# Patient Record
Sex: Female | Born: 1940 | Race: White | Hispanic: No | Smoking: Never smoker
Health system: Southern US, Community
[De-identification: ages and names within clinical notes are randomized; demographics above are authoritative.]

## PROBLEM LIST (undated history)

## (undated) DIAGNOSIS — I10 Essential (primary) hypertension: Secondary | ICD-10-CM

## (undated) DIAGNOSIS — E785 Hyperlipidemia, unspecified: Secondary | ICD-10-CM

## (undated) DIAGNOSIS — E079 Disorder of thyroid, unspecified: Secondary | ICD-10-CM

## (undated) HISTORY — DX: Disorder of thyroid, unspecified: E07.9

## (undated) HISTORY — DX: Essential (primary) hypertension: I10

## (undated) HISTORY — DX: Hyperlipidemia, unspecified: E78.5

---

## 1998-06-10 ENCOUNTER — Ambulatory Visit (HOSPITAL_COMMUNITY): Admission: RE | Admit: 1998-06-10 | Discharge: 1998-06-10 | Payer: Self-pay | Admitting: Internal Medicine

## 1999-09-01 ENCOUNTER — Other Ambulatory Visit: Admission: RE | Admit: 1999-09-01 | Discharge: 1999-09-01 | Payer: Self-pay | Admitting: Internal Medicine

## 2003-02-12 ENCOUNTER — Ambulatory Visit (HOSPITAL_COMMUNITY): Admission: RE | Admit: 2003-02-12 | Discharge: 2003-02-12 | Payer: Self-pay | Admitting: Internal Medicine

## 2003-09-07 ENCOUNTER — Encounter: Admission: RE | Admit: 2003-09-07 | Discharge: 2003-09-07 | Payer: Self-pay | Admitting: Internal Medicine

## 2003-12-31 ENCOUNTER — Other Ambulatory Visit: Admission: RE | Admit: 2003-12-31 | Discharge: 2003-12-31 | Payer: Self-pay | Admitting: Internal Medicine

## 2004-08-31 IMAGING — CT CT PELVIS W/ CM
1 of 2 series · 15 of 32 positions shown, 19 images · IV contrast (GASTRO. & OMNIPAQUE [ID])
Comparison: none

CLINICAL DATA: Left flank pain.   Con ? none.
CT ABDOMEN AND PELVIS WITH CONTRAST
TECHNIQUE: Multislice axial images were obtained through the abdomen after administration of oral contrast and before and after administration of 100 cc of Omnipaque 300 IV contrast.  There are no prior studies for comparison purposes. 
CT ABDOMEN:
The lung bases are clear.  The liver, spleen, pancreas, adrenal glands, and both kidneys have a normal appearance.  No renal 
or ureteral calculi are present.  There are no secondary signs of renal obstruction.  A tiny vascular calcification is seen medial to the right kidney.    No free abdominal fluid or adenopathy are seen.  There is soft tissue prominence and mild narrowing of the ascending colon which persists on the delayed images.  Atherosclerotic calcification is seen within the distal aorta and iliac arteries.  
IMPRESSION
There is soft tissue prominence and mild narrowing of the ascending colon near the hepatic flexure.  A colonoscopy or barium enema are recommended for better characterization of this region.  Differential considerations include marked spasm versus a constricting mass.  
CT PELVIS:
The urinary bladder has an unremarkable appearance.  There is no free pelvic fluid or adenopathy.  The osseous structures are unremarkable.  
Unremarkable CT scan of the pelvis.

[Series 3: routine abdomen · axial · 0.78mm/px · z∈[-401,-26]mm · 15 of 113 slices shown, 19 images]
[im 5/113  soft-tissue]
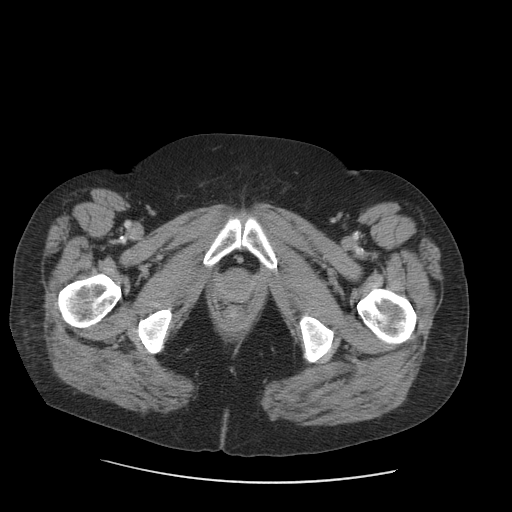
[im 5/113  bone]
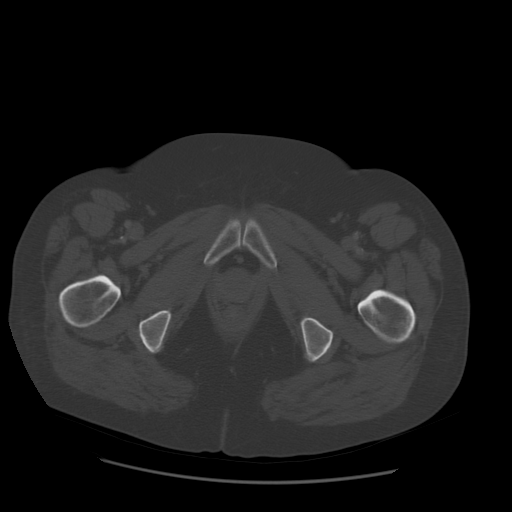
[im 15/113  soft-tissue]
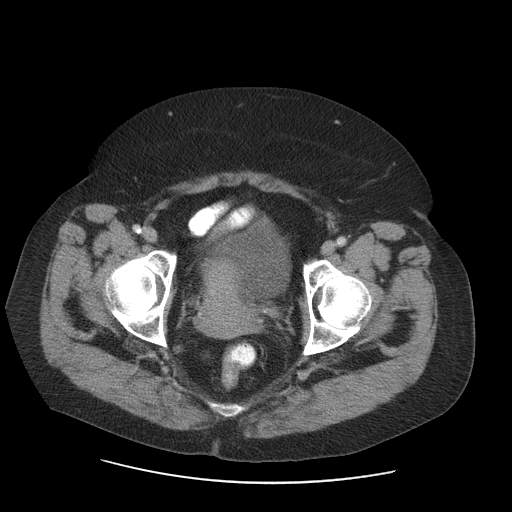
[im 24/113  soft-tissue]
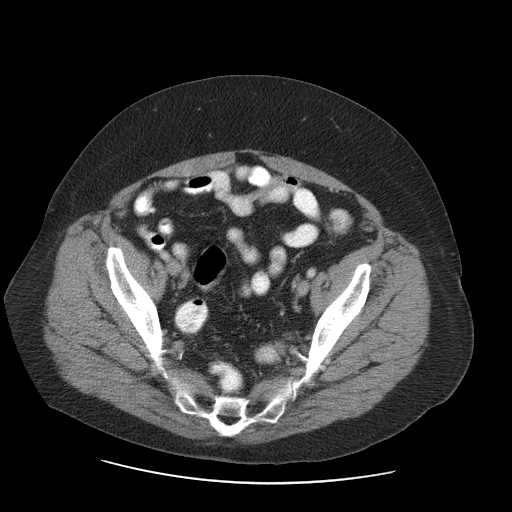
[im 33/113  soft-tissue]
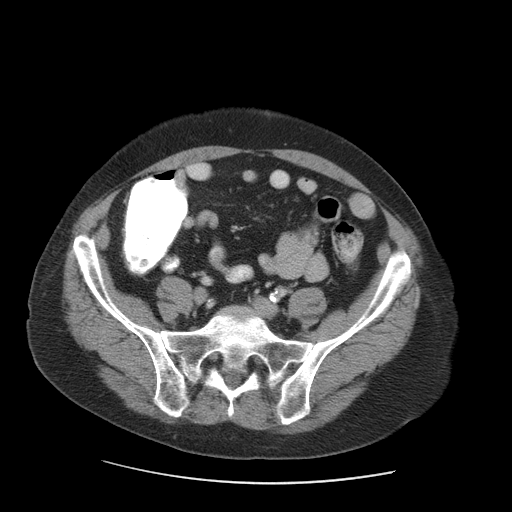
[im 38/113  soft-tissue]
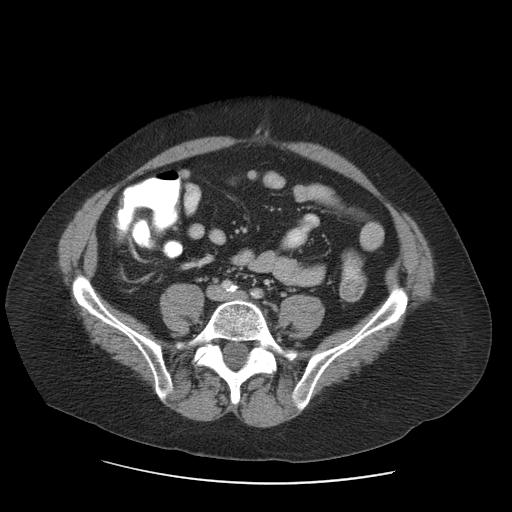
[im 47/113  soft-tissue]
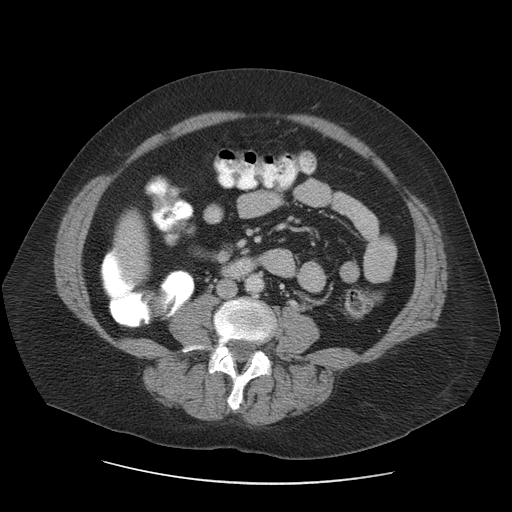
[im 57/113  soft-tissue]
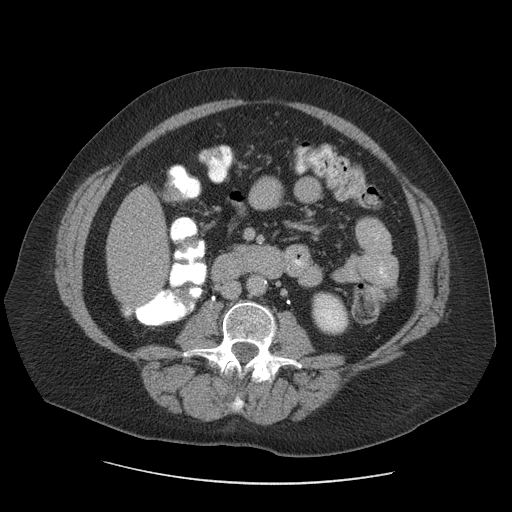
[im 66/113  soft-tissue]
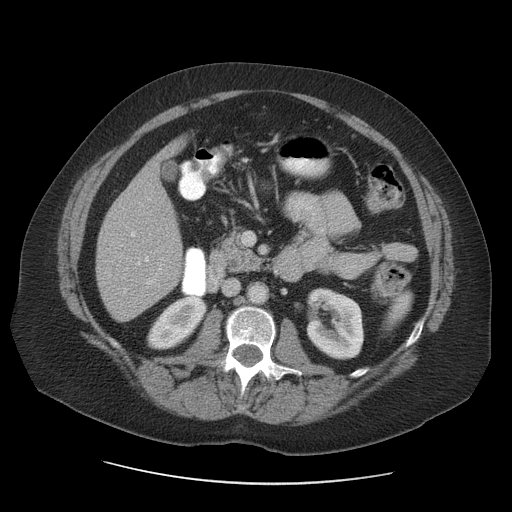
[im 75/113  soft-tissue]
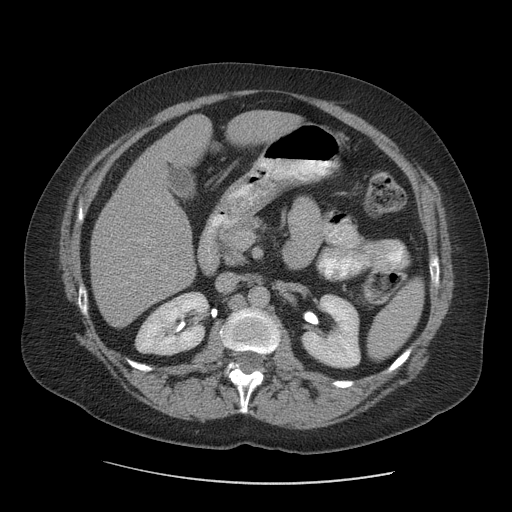
[im 75/113  bone]
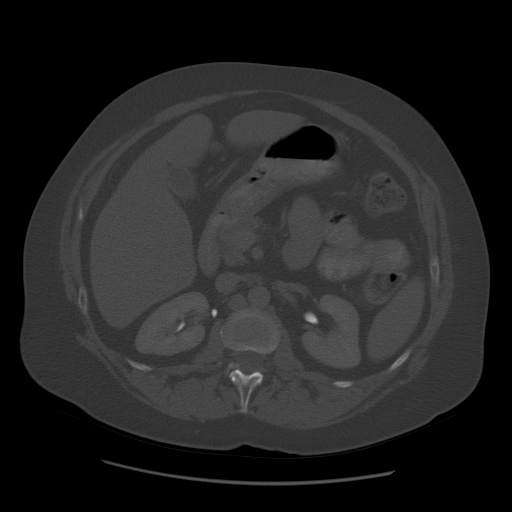
[im 80/113  soft-tissue]
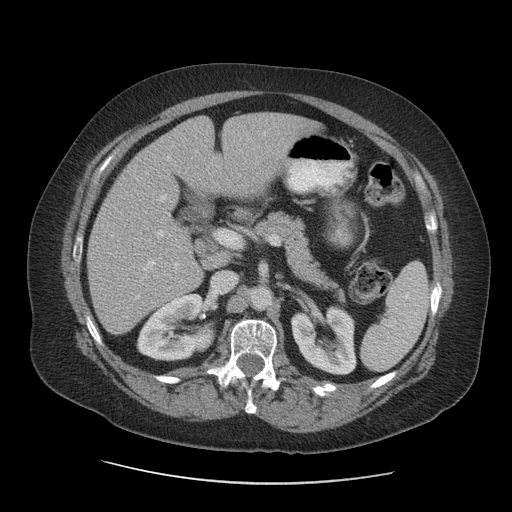
[im 89/113  soft-tissue]
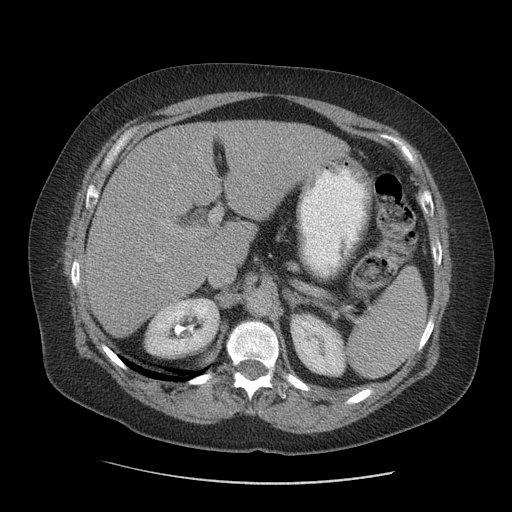
[im 94/113  lung]
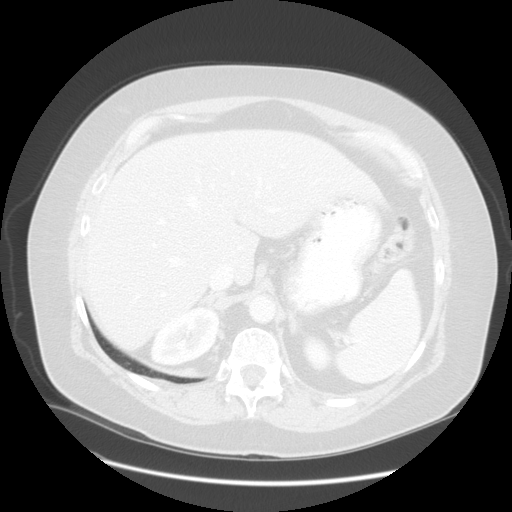
[im 99/113  soft-tissue]
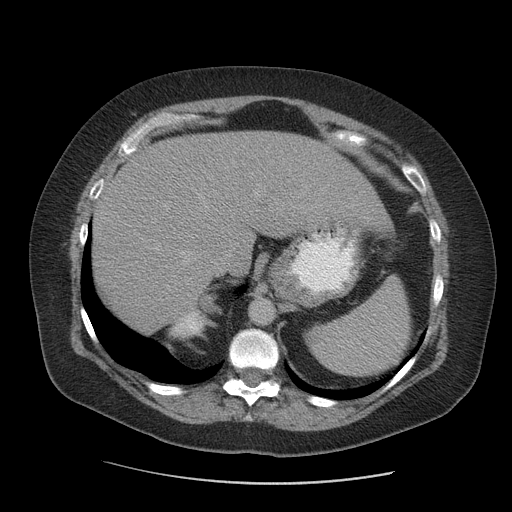
[im 99/113  lung]
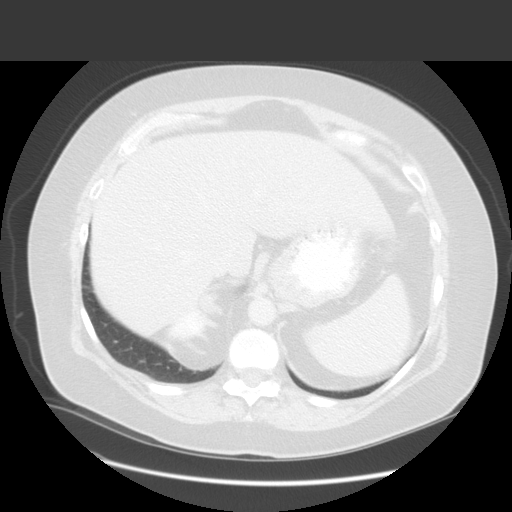
[im 103/113  lung]
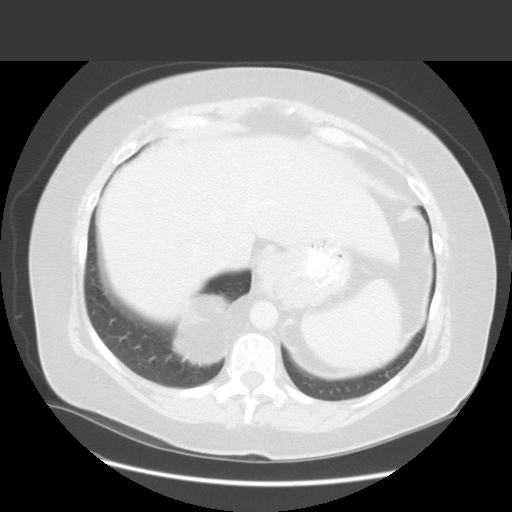
[im 108/113  soft-tissue]
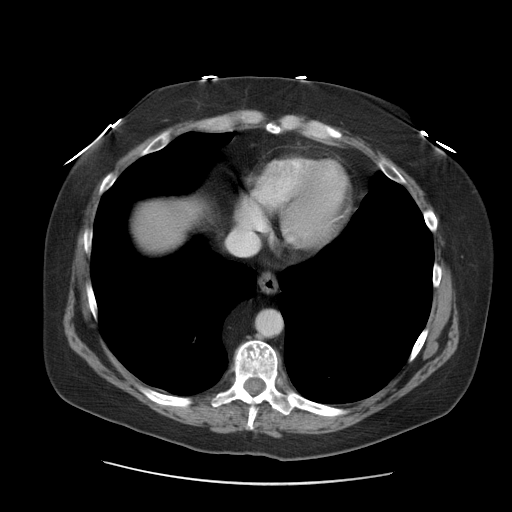
[im 108/113  lung]
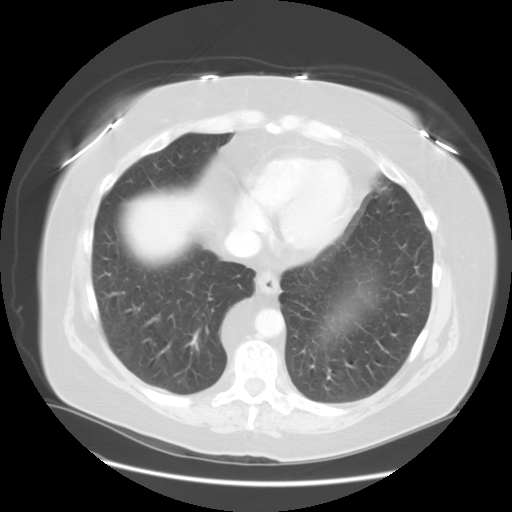

[15 of 32 positions shown; findings below may reference images not displayed]

## 2007-12-17 ENCOUNTER — Other Ambulatory Visit: Admission: RE | Admit: 2007-12-17 | Discharge: 2007-12-17 | Payer: Self-pay | Admitting: Internal Medicine

## 2011-01-18 ENCOUNTER — Other Ambulatory Visit (HOSPITAL_COMMUNITY)
Admission: RE | Admit: 2011-01-18 | Discharge: 2011-01-18 | Disposition: A | Discharge status: Home / Self Care | Payer: Medicare Other | Source: Ambulatory Visit | Attending: Internal Medicine | Admitting: Internal Medicine

## 2011-01-18 DIAGNOSIS — Z124 Encounter for screening for malignant neoplasm of cervix: Secondary | ICD-10-CM | POA: Insufficient documentation

## 2011-02-02 NOTE — Op Note (Signed)
   NAME:  Cork, Oluwanifemi B                         ACCOUNT NO.:  [id number]   MEDICAL RECORD NO.:  [id number]                   PATIENT TYPE:  AMB   LOCATION:  ENDO                                 FACILITY:  [facility]   PHYSICIAN:  Lina Sar, M.D. [facility]               DATE OF BIRTH:  1941-01-08   DATE OF PROCEDURE:  02/12/2003  DATE OF DISCHARGE:                                 OPERATIVE REPORT   PROCEDURE:  Colonoscopy.   INDICATIONS FOR PROCEDURE:  This 70 year old white female is undergoing a  screening colonoscopy.  She denies any GI symptoms.  Her stool was Hemoccult  negative in the past.  She has never had a previous colonoscopy.  There is  no family history of colon cancer.   ENDOSCOPE:  Olympus single chamber video endoscope.   SEDATION:  1. Versed 7 mg IV.  2. Demerol 80 mg IV.   DESCRIPTION OF PROCEDURE:  The Olympus single chamber video endoscope was  passed under direct vision to the sigmoid colon.  The patient was monitored  by pulse oximeter, oxygen saturations were normal.  Her blood pressure was  low as 84 systolic, and her pulse slowed down to 37 to 38 beats per minute  during the procedure.  The anal canal and rectal ampulla was normal.  Retroflexion of endoscope in the rectum showed normal rectal ampulla.  Sigmoid colon was traversed without difficulty, and showed normal appearing  mucosa.  There were no diverticula, although haustral folds were somewhat  enlarged.  I was unable to see any diverticulosis.  The descending colon,  splenic flexure, transverse colon were unremarkable.  The patient required a  lot of sedation.  Hepatic flexure, ascending colon, and cecum were normal.  Colonoscope was then retracted, the colon was decompressed.  Video  photographs of the rectum as well as cecal pouch were obtained.  The patient  tolerated the procedure well.   IMPRESSION:  Normal colonoscopy to the cecum.   PLAN:  1. Use Hemoccult cards.  2.     High fiber diet.  3. Repeat colonoscopy in 10 years.  4. The patient will follow up with Dr. Elmore Guise.                                               Lina Sar, M.D. [facility]    DB/MEDQ  D:  02/12/2003  T:  02/12/2003  Job:  [id number]   cc:   Erskine Speed, M.D.  [address] [place]  [place]  [place] [address]  Fax: [phone]

## 2014-10-07 DIAGNOSIS — H6063 Unspecified chronic otitis externa, bilateral: Secondary | ICD-10-CM | POA: Diagnosis not present

## 2014-10-07 DIAGNOSIS — H6122 Impacted cerumen, left ear: Secondary | ICD-10-CM | POA: Diagnosis not present

## 2014-10-13 DIAGNOSIS — H52223 Regular astigmatism, bilateral: Secondary | ICD-10-CM | POA: Diagnosis not present

## 2014-10-13 DIAGNOSIS — H5203 Hypermetropia, bilateral: Secondary | ICD-10-CM | POA: Diagnosis not present

## 2014-10-29 DIAGNOSIS — E780 Pure hypercholesterolemia: Secondary | ICD-10-CM | POA: Diagnosis not present

## 2014-10-29 DIAGNOSIS — I10 Essential (primary) hypertension: Secondary | ICD-10-CM | POA: Diagnosis not present

## 2015-02-02 DIAGNOSIS — I10 Essential (primary) hypertension: Secondary | ICD-10-CM | POA: Diagnosis not present

## 2015-02-02 DIAGNOSIS — Z0000 Encounter for general adult medical examination without abnormal findings: Secondary | ICD-10-CM | POA: Diagnosis not present

## 2015-02-02 DIAGNOSIS — E039 Hypothyroidism, unspecified: Secondary | ICD-10-CM | POA: Diagnosis not present

## 2015-02-02 DIAGNOSIS — D559 Anemia due to enzyme disorder, unspecified: Secondary | ICD-10-CM | POA: Diagnosis not present

## 2015-02-02 DIAGNOSIS — Z23 Encounter for immunization: Secondary | ICD-10-CM | POA: Diagnosis not present

## 2015-02-02 DIAGNOSIS — E780 Pure hypercholesterolemia: Secondary | ICD-10-CM | POA: Diagnosis not present

## 2015-03-16 DIAGNOSIS — Z1231 Encounter for screening mammogram for malignant neoplasm of breast: Secondary | ICD-10-CM | POA: Diagnosis not present

## 2015-03-23 DIAGNOSIS — I10 Essential (primary) hypertension: Secondary | ICD-10-CM | POA: Diagnosis not present

## 2015-03-23 DIAGNOSIS — F339 Major depressive disorder, recurrent, unspecified: Secondary | ICD-10-CM | POA: Diagnosis not present

## 2015-08-02 DIAGNOSIS — I10 Essential (primary) hypertension: Secondary | ICD-10-CM | POA: Diagnosis not present

## 2015-08-02 DIAGNOSIS — Z23 Encounter for immunization: Secondary | ICD-10-CM | POA: Diagnosis not present

## 2015-08-02 DIAGNOSIS — E039 Hypothyroidism, unspecified: Secondary | ICD-10-CM | POA: Diagnosis not present

## 2015-09-20 DIAGNOSIS — I10 Essential (primary) hypertension: Secondary | ICD-10-CM | POA: Diagnosis not present

## 2015-09-20 DIAGNOSIS — E039 Hypothyroidism, unspecified: Secondary | ICD-10-CM | POA: Diagnosis not present

## 2015-12-08 DIAGNOSIS — H6063 Unspecified chronic otitis externa, bilateral: Secondary | ICD-10-CM | POA: Diagnosis not present

## 2015-12-08 DIAGNOSIS — H6122 Impacted cerumen, left ear: Secondary | ICD-10-CM | POA: Diagnosis not present

## 2016-02-02 DIAGNOSIS — E039 Hypothyroidism, unspecified: Secondary | ICD-10-CM | POA: Diagnosis not present

## 2016-02-02 DIAGNOSIS — I119 Hypertensive heart disease without heart failure: Secondary | ICD-10-CM | POA: Diagnosis not present

## 2016-02-02 DIAGNOSIS — Z0000 Encounter for general adult medical examination without abnormal findings: Secondary | ICD-10-CM | POA: Diagnosis not present

## 2016-04-02 DIAGNOSIS — Z1231 Encounter for screening mammogram for malignant neoplasm of breast: Secondary | ICD-10-CM | POA: Diagnosis not present

## 2016-04-09 DIAGNOSIS — E039 Hypothyroidism, unspecified: Secondary | ICD-10-CM | POA: Diagnosis not present

## 2016-04-09 DIAGNOSIS — E785 Hyperlipidemia, unspecified: Secondary | ICD-10-CM | POA: Diagnosis not present

## 2016-04-09 DIAGNOSIS — I10 Essential (primary) hypertension: Secondary | ICD-10-CM | POA: Diagnosis not present

## 2016-07-05 DIAGNOSIS — I10 Essential (primary) hypertension: Secondary | ICD-10-CM | POA: Diagnosis not present

## 2016-07-05 DIAGNOSIS — Z23 Encounter for immunization: Secondary | ICD-10-CM | POA: Diagnosis not present

## 2016-07-05 DIAGNOSIS — E039 Hypothyroidism, unspecified: Secondary | ICD-10-CM | POA: Diagnosis not present

## 2019-11-22 ENCOUNTER — Ambulatory Visit: Payer: Medicare Other | Attending: Internal Medicine

## 2019-11-22 DIAGNOSIS — Z23 Encounter for immunization: Secondary | ICD-10-CM | POA: Insufficient documentation

## 2019-11-22 NOTE — Progress Notes (Signed)
   Covid-19 Vaccination Clinic  Name:  Dawn Whitney    MRN: [id number] DOB: August 16, 1941  11/22/2019  Dawn Whitney was observed post Covid-19 immunization for 15 minutes without incident. She was provided with Vaccine Information Sheet and instruction to access the V-Safe system.   Dawn Whitney was instructed to call 911 with any severe reactions post vaccine: Marland Kitchen Difficulty breathing  . Swelling of face and throat  . A fast heartbeat  . A bad rash all over body  . Dizziness and weakness   Immunizations Administered    Name Date Dose VIS Date Route   Pfizer COVID-19 Vaccine 11/22/2019  3:50 PM 0.3 mL 08/28/2019 Intramuscular   Manufacturer: [place]   Lot: [id number]   [place]: [id number]

## 2019-11-23 ENCOUNTER — Other Ambulatory Visit: Payer: Self-pay | Admitting: Internal Medicine

## 2019-11-23 DIAGNOSIS — Z13820 Encounter for screening for osteoporosis: Secondary | ICD-10-CM

## 2019-12-22 ENCOUNTER — Ambulatory Visit: Payer: Medicare Other | Attending: Internal Medicine

## 2019-12-22 DIAGNOSIS — Z23 Encounter for immunization: Secondary | ICD-10-CM

## 2019-12-22 NOTE — Progress Notes (Signed)
   Covid-19 Vaccination Clinic  Name:  Dawn Whitney    MRN: [id number] DOB: [birth year]  12/22/2019  Dawn Whitney was observed post Covid-19 immunization for 15 minutes without incident. She was provided with Vaccine Information Sheet and instruction to access the V-Safe system.   Dawn Whitney was instructed to call 911 with any severe reactions post vaccine: Marland Kitchen Difficulty breathing  . Swelling of face and throat  . A fast heartbeat  . A bad rash all over body  . Dizziness and weakness   Immunizations Administered    Name Date Dose VIS Date Route   Pfizer COVID-19 Vaccine 12/22/2019  1:43 PM 0.3 mL 08/28/2019 Intramuscular   Manufacturer: [employer]   Lot: [id number]   [place]: [id number]

## 2020-05-02 ENCOUNTER — Other Ambulatory Visit: Payer: Self-pay

## 2020-05-02 ENCOUNTER — Ambulatory Visit (INDEPENDENT_AMBULATORY_CARE_PROVIDER_SITE_OTHER): Payer: Medicare Other | Admitting: Otolaryngology

## 2020-05-02 VITALS — Temp 97.2°F

## 2020-05-02 DIAGNOSIS — H6123 Impacted cerumen, bilateral: Secondary | ICD-10-CM

## 2020-05-02 NOTE — Progress Notes (Signed)
HPI: Dawn Whitney is a 79 y.o. female who presents for evaluation of wax buildup in both ear canals.  She has had this cleaned previously with Dr Ernesto Rutherford on a regular basis.  However the last time it was cleaned was over a year ago.Marland Kitchen  No past medical history on file.  Social History   Socioeconomic History  . Marital status: Widowed    Spouse name: Not on file  . Number of children: Not on file  . Years of education: Not on file  . Highest education level: Not on file  Occupational History  . Not on file  Tobacco Use  . Smoking status: Not on file  Substance and Sexual Activity  . Alcohol use: Not on file  . Drug use: Not on file  . Sexual activity: Not on file  Other Topics Concern  . Not on file  Social History Narrative  . Not on file   Social Determinants of Health   Financial Resource Strain:   . Difficulty of Paying Living Expenses:   Food Insecurity:   . Worried About Charity fundraiser in the Last Year:   . Arboriculturist in the Last Year:   Transportation Needs:   . Film/video editor (Medical):   Marland Kitchen Lack of Transportation (Non-Medical):   Physical Activity:   . Days of Exercise per Week:   . Minutes of Exercise per Session:   Stress:   . Feeling of Stress :   Social Connections:   . Frequency of Communication with Friends and Family:   . Frequency of Social Gatherings with Friends and Family:   . Attends Religious Services:   . Active Member of Clubs or Organizations:   . Attends Archivist Meetings:   Marland Kitchen Marital Status:    No family history on file. No Known Allergies Prior to Admission medications   Not on File     Positive ROS: Otherwise negative  All other systems have been reviewed and were otherwise negative with the exception of those mentioned in the HPI and as above.  Physical Exam: Constitutional: Alert, well-appearing, no acute distress Ears: External ears without lesions or tenderness. Ear canals are small  bilaterally with a large amount of wax in both ear canals that was cleaned with forceps and curettes.  TMs were clear bilaterally.. Nasal: External nose without lesions. Clear nasal passages Oral: Oropharynx clear. Neck: No palpable adenopathy or masses Respiratory: Breathing comfortably  Skin: No facial/neck lesions or rash noted.  Cerumen impaction removal  Date/Time: 05/02/2020 3:59 PM Performed by: Rozetta Nunnery, MD Authorized by: Rozetta Nunnery, MD   Consent:    Consent obtained:  Verbal   Consent given by:  Patient   Risks discussed:  Pain and bleeding Procedure details:    Location:  L ear and R ear   Procedure type: curette and forceps   Post-procedure details:    Inspection:  TM intact and canal normal   Hearing quality:  Improved   Patient tolerance of procedure:  Tolerated well, no immediate complications Comments:     TMs are clear bilaterally    Assessment: Bilateral cerumen impactions. Small ear canals bilaterally.  Plan: She will follow up on a as needed basis.  Radene Journey, MD

## 2020-10-21 ENCOUNTER — Encounter: Payer: Self-pay | Admitting: Gastroenterology

## 2020-11-01 ENCOUNTER — Encounter: Payer: Self-pay | Admitting: Gastroenterology

## 2020-11-01 ENCOUNTER — Other Ambulatory Visit (INDEPENDENT_AMBULATORY_CARE_PROVIDER_SITE_OTHER): Payer: Medicare Other

## 2020-11-01 ENCOUNTER — Other Ambulatory Visit: Payer: Self-pay

## 2020-11-01 ENCOUNTER — Ambulatory Visit: Payer: Medicare Other | Admitting: Gastroenterology

## 2020-11-01 VITALS — BP 98/60 | HR 57 | Ht 65.0 in | Wt 141.0 lb

## 2020-11-01 DIAGNOSIS — R194 Change in bowel habit: Secondary | ICD-10-CM | POA: Diagnosis not present

## 2020-11-01 DIAGNOSIS — R197 Diarrhea, unspecified: Secondary | ICD-10-CM

## 2020-11-01 DIAGNOSIS — R634 Abnormal weight loss: Secondary | ICD-10-CM

## 2020-11-01 LAB — CBC WITH DIFFERENTIAL/PLATELET
Basophils Absolute: 0.1 10*3/uL (ref 0.0–0.1)
Basophils Relative: 0.8 % (ref 0.0–3.0)
Eosinophils Absolute: 0.2 10*3/uL (ref 0.0–0.7)
Eosinophils Relative: 2.1 % (ref 0.0–5.0)
HCT: 37.4 % (ref 36.0–46.0)
Hemoglobin: 12.8 g/dL (ref 12.0–15.0)
Lymphocytes Relative: 34.1 % (ref 12.0–46.0)
Lymphs Abs: 2.9 10*3/uL (ref 0.7–4.0)
MCHC: 34.1 g/dL (ref 30.0–36.0)
MCV: 89.7 fl (ref 78.0–100.0)
Monocytes Absolute: 0.8 10*3/uL (ref 0.1–1.0)
Monocytes Relative: 9.1 % (ref 3.0–12.0)
Neutro Abs: 4.6 10*3/uL (ref 1.4–7.7)
Neutrophils Relative %: 53.9 % (ref 43.0–77.0)
Platelets: 189 10*3/uL (ref 150.0–400.0)
RBC: 4.17 Mil/uL (ref 3.87–5.11)
RDW: 14.1 % (ref 11.5–15.5)
WBC: 8.5 10*3/uL (ref 4.0–10.5)

## 2020-11-01 LAB — SEDIMENTATION RATE: Sed Rate: 33 mm/hr — ABNORMAL HIGH (ref 0–30)

## 2020-11-01 LAB — BASIC METABOLIC PANEL
BUN: 22 mg/dL (ref 6–23)
CO2: 33 mEq/L — ABNORMAL HIGH (ref 19–32)
Calcium: 9.7 mg/dL (ref 8.4–10.5)
Chloride: 95 mEq/L — ABNORMAL LOW (ref 96–112)
Creatinine, Ser: 1.28 mg/dL — ABNORMAL HIGH (ref 0.40–1.20)
GFR: 39.96 mL/min — ABNORMAL LOW (ref >60.00)
Glucose, Bld: 99 mg/dL (ref 70–99)
Potassium: 3.7 mEq/L (ref 3.5–5.1)
Sodium: 135 mEq/L (ref 135–145)

## 2020-11-01 LAB — C-REACTIVE PROTEIN: CRP: <1 mg/dL (ref 0.5–20.0)

## 2020-11-01 LAB — TSH: TSH: 29.29 u[IU]/mL — ABNORMAL HIGH (ref 0.35–4.50)

## 2020-11-01 MED ORDER — DIPHENOXYLATE-ATROPINE 2.5-0.025 MG PO TABS: 1.0000 | ORAL_TABLET | Freq: Three times a day (TID) | ORAL | 1 refills | Status: AC | PRN

## 2020-11-01 MED ORDER — PLENVU 140 G PO SOLR: 1.0000 | Freq: Once | ORAL | 0 refills | Status: AC

## 2020-11-01 NOTE — Patient Instructions (Signed)
If you are age 79 or older, your body mass index should be between 23-30. Your Body mass index is 23.46 kg/m. If this is out of the aforementioned range listed, please consider follow up with your Primary Care Provider.  If you are age 69 or younger, your body mass index should be between 19-25. Your Body mass index is 23.46 kg/m. If this is out of the aformentioned range listed, please consider follow up with your Primary Care Provider.   Your provider has requested that you go to the basement level for lab work before leaving today. Press "B" on the elevator. The lab is located at the first door on the left as you exit the elevator.  We have sent the following medications to your pharmacy for you to pick up at your convenience: Lomotil every 8 hours as needed.   You have been scheduled for a colonoscopy. Please follow written instructions given to you at your visit today.  Please pick up your prep supplies at the pharmacy within the next 1-3 days. If you use inhalers (even only as needed), please bring them with you on the day of your procedure.  Due to recent changes in healthcare laws, you may see the results of your imaging and laboratory studies on MyChart before your provider has had a chance to review them.  We understand that in some cases there may be results that are confusing or concerning to you. Not all laboratory results come back in the same time frame and the provider may be waiting for multiple results in order to interpret others.  Please give [place] 48 hours in order for your provider to thoroughly review all the results before contacting the office for clarification of your results.

## 2020-11-01 NOTE — Progress Notes (Signed)
11/01/2020 Dawn Whitney [id number] [birth year]   HISTORY OF PRESENT ILLNESS:  This is a 80 year old female who is a remote patient of Dr. Kelby Fam (so long ago that I cannot see any records in our system).  She is here today with complaints of diarrhea.  She tells me that this has been going on for about the past 6 weeks or so.  She saw her PCP once and they recommended that she not eat certain foods.  She says that she cannot go to church or anything.  Anytime she tries to eat something she has diarrhea.  She also reports that she is lost about 10 pounds, but denies any abdominal pain, rectal bleeding.  No nocturnal stools.  Her last colonoscopy was well over 10 years ago.   Past Medical History:  Diagnosis Date  . Hyperlipidemia   . Hypertension   . Thyroid disease    History reviewed. No pertinent surgical history.  reports that she has never smoked. She does not have any smokeless tobacco history on file. She reports that she does not drink alcohol and does not use drugs. family history includes Lung cancer in her sister. No Known Allergies    Outpatient Encounter Medications as of 11/01/2020  Medication Sig  . ALPRAZolam (XANAX) 0.25 MG tablet TAKE 1 TABLET BY MOUTH AT BEDTIME AND TAKE 1 TABLET DURING THE DAY AS NEEDED (30 DAY SUPPLY)  . atenolol (TENORMIN) 50 MG tablet Take 50 mg by mouth daily.  Marland Kitchen buPROPion (WELLBUTRIN XL) 150 MG 24 hr tablet 1 tablet in the morning  . hydrochlorothiazide (HYDRODIURIL) 25 MG tablet Take 25 mg by mouth every morning.  Marland Kitchen levothyroxine (SYNTHROID) 150 MCG tablet SMARTSIG:1 Tablet(s) By Mouth 6 Times a Week  . rosuvastatin (CRESTOR) 20 MG tablet Take 20 mg by mouth daily.  . traZODone (DESYREL) 150 MG tablet Take 150 mg by mouth at bedtime as needed.   No facility-administered encounter medications on file as of 11/01/2020.     REVIEW OF SYSTEMS  : All other systems reviewed and negative except where noted in the History of Present  Illness.   PHYSICAL EXAM: BP 98/60   Pulse (!) 57   Ht 5\' 5"  (1.651 m)   Wt 141 lb (64 kg)   BMI 23.46 kg/m  General: Well developed white female in no acute distress Head: Normocephalic and atraumatic Eyes:  Sclerae anicteric, conjunctiva pink. Ears: Normal auditory acuity Lungs: Clear throughout to auscultation; no W/R/R. Heart:  Slightly bradycardic.  No M/R/G. Abdomen: Soft, non-distended.  BS present.  Non-tender. Rectal:  Will be done at the time of colonoscopy. Musculoskeletal: Symmetrical with no gross deformities  Skin: No lesions on visible extremities Extremities: No edema  Neurological: Alert oriented x 4, grossly non-focal Psychological:  Alert and cooperative. Normal mood and affect  ASSESSMENT AND PLAN: *80 year old female with a change in bowel habits with postprandial diarrhea for the past 6 weeks or so.  Reports a 10 pound weight loss as well, but no other associated symptoms.  I do not think that this is infectious, but will check a stool for C. difficile just to be sure.  We will also check a CBC, BMP, TSH, sed rate, CRP, and celiac labs.  Last colonoscopy was well over 10 years ago.  We will plan for colonoscopy with Dr. Havery Moros pending that C. difficile stool studies negative.  The risks, benefits, and alternatives to colonoscopy were discussed with the patient and he consents  to proceed.  Will send a prescription for lomotil to use pending C diff is negative.   CC:  No ref. provider found

## 2020-11-02 LAB — IGA: Immunoglobulin A: 338 mg/dL — ABNORMAL HIGH (ref 70–320)

## 2020-11-02 LAB — TISSUE TRANSGLUTAMINASE ABS,IGG,IGA
(tTG) Ab, IgA: <1 U/mL
(tTG) Ab, IgG: <1 U/mL

## 2020-11-02 NOTE — Progress Notes (Signed)
Agree with assessment and plan as outlined.  

## 2020-11-28 ENCOUNTER — Telehealth: Payer: Self-pay | Admitting: Gastroenterology

## 2020-11-28 NOTE — Telephone Encounter (Signed)
The pt states that she is suppose to have a C diff test for diarrhea prior to her upcoming Colon.  She states she stopped taking lomotil because she became constipated.  She no longer has diarrhea. Can she proceed with colon without stool for C diff.  She has been off lomotil for several days.  Please advise

## 2020-11-28 NOTE — Telephone Encounter (Signed)
Patient called requesting to speak with a nurse states she has not been able to have a BM for about a week now also said she has not taken the Lomotil medication.

## 2020-11-29 NOTE — Telephone Encounter (Signed)
The pt has been advised to proceed with Colon. However, she should call if the diarrhea returns.

## 2020-11-29 NOTE — Telephone Encounter (Signed)
Okay to proceed, but she needs to perform the stool study if the diarrhea returns in the interim.

## 2020-12-23 ENCOUNTER — Ambulatory Visit (AMBULATORY_SURGERY_CENTER): Payer: Medicare Other | Admitting: Gastroenterology

## 2020-12-23 ENCOUNTER — Other Ambulatory Visit: Payer: Self-pay

## 2020-12-23 ENCOUNTER — Encounter: Payer: Self-pay | Admitting: Gastroenterology

## 2020-12-23 VITALS — BP 123/54 | HR 60 | Temp 96.8°F | Resp 15 | Ht 65.0 in | Wt 141.0 lb

## 2020-12-23 DIAGNOSIS — D123 Benign neoplasm of transverse colon: Secondary | ICD-10-CM

## 2020-12-23 DIAGNOSIS — D120 Benign neoplasm of cecum: Secondary | ICD-10-CM | POA: Diagnosis not present

## 2020-12-23 DIAGNOSIS — K5730 Diverticulosis of large intestine without perforation or abscess without bleeding: Secondary | ICD-10-CM | POA: Diagnosis not present

## 2020-12-23 DIAGNOSIS — D125 Benign neoplasm of sigmoid colon: Secondary | ICD-10-CM

## 2020-12-23 DIAGNOSIS — D122 Benign neoplasm of ascending colon: Secondary | ICD-10-CM | POA: Diagnosis not present

## 2020-12-23 DIAGNOSIS — R197 Diarrhea, unspecified: Secondary | ICD-10-CM

## 2020-12-23 DIAGNOSIS — D124 Benign neoplasm of descending colon: Secondary | ICD-10-CM | POA: Diagnosis not present

## 2020-12-23 DIAGNOSIS — K648 Other hemorrhoids: Secondary | ICD-10-CM | POA: Diagnosis not present

## 2020-12-23 MED ORDER — SODIUM CHLORIDE 0.9 % IV SOLN: 500.0000 mL | Freq: Once | INTRAVENOUS | Status: DC

## 2020-12-23 NOTE — Progress Notes (Signed)
Report given to PACU, vss 

## 2020-12-23 NOTE — Patient Instructions (Signed)
Handouts given:  Polyps, Diverticulosis,  Resume previous diet Continue current medications Take immodium every morning and increase as needed Await pathology results YOU HAD AN ENDOSCOPIC PROCEDURE TODAY AT [facility]:   Refer to the procedure report that was given to you for any specific questions about what was found during the examination.  If the procedure report does not answer your questions, please call your gastroenterologist to clarify.  If you requested that your care partner not be given the details of your procedure findings, then the procedure report has been included in a sealed envelope for you to review at your convenience later.  YOU SHOULD EXPECT: Some feelings of bloating in the abdomen. Passage of more gas than usual.  Walking can help get rid of the air that was put into your GI tract during the procedure and reduce the bloating. If you had a lower endoscopy (such as a colonoscopy or flexible sigmoidoscopy) you may notice spotting of blood in your stool or on the toilet paper. If you underwent a bowel prep for your procedure, you may not have a normal bowel movement for a few days.  Please Note:  You might notice some irritation and congestion in your nose or some drainage.  This is from the oxygen used during your procedure.  There is no need for concern and it should clear up in a day or so.  SYMPTOMS TO REPORT IMMEDIATELY:   Following lower endoscopy (colonoscopy or flexible sigmoidoscopy):  Excessive amounts of blood in the stool  Significant tenderness or worsening of abdominal pains  Swelling of the abdomen that is new, acute  Fever of 100F or higher  For urgent or emergent issues, a gastroenterologist can be reached at any hour by calling [phone]. Do not use MyChart messaging for urgent concerns.   DIET:  We do recommend a small meal at first, but then you may proceed to your regular diet.  Drink plenty of fluids but you should avoid  alcoholic beverages for 24 hours.  ACTIVITY:  You should plan to take it easy for the rest of today and you should NOT DRIVE or use heavy machinery until tomorrow (because of the sedation medicines used during the test).    FOLLOW UP: Our staff will call the number listed on your records 48-72 hours following your procedure to check on you and address any questions or concerns that you may have regarding the information given to you following your procedure. If we do not reach you, we will leave a message.  We will attempt to reach you two times.  During this call, we will ask if you have developed any symptoms of COVID 19. If you develop any symptoms (ie: fever, flu-like symptoms, shortness of breath, cough etc.) before then, please call [phone].  If you test positive for Covid 19 in the 2 weeks post procedure, please call and report this information to [place].    If any biopsies were taken you will be contacted by phone or by letter within the next 1-3 weeks.  Please call us at [phone] if you have not heard about the biopsies in 3 weeks.   SIGNATURES/CONFIDENTIALITY: You and/or your care partner have signed paperwork which will be entered into your electronic medical record.  These signatures attest to the fact that that the information above on your After Visit Summary has been reviewed and is understood.  Full responsibility of the confidentiality of this discharge information lies with you and/or  your care-partner. 

## 2020-12-23 NOTE — Progress Notes (Signed)
Called to room to assist during endoscopic procedure.  Patient ID and intended procedure confirmed with present staff. Received instructions for my participation in the procedure from the performing physician.  

## 2020-12-23 NOTE — Op Note (Signed)
[facility] Patient Name: Dawn Whitney Procedure Date: 12/23/2020 4:03 PM MRN: [id number] Endoscopist: Remo Lipps P. Havery Moros , MD Age: 80 Referring MD:  Date of Birth: 1941/03/26 Gender: Female Account #: [id number] Procedure:                Colonoscopy Indications:              Clinically significant diarrhea of unexplained                            origin, persistent over several weeks however seems                            to be improving with time Medicines:                Monitored Anesthesia Care Procedure:                Pre-Anesthesia Assessment:                           - Prior to the procedure, a History and Physical                            was performed, and patient medications and                            allergies were reviewed. The patient's tolerance of                            previous anesthesia was also reviewed. The risks                            and benefits of the procedure and the sedation                            options and risks were discussed with the patient.                            All questions were answered, and informed consent                            was obtained. Prior Anticoagulants: The patient has                            taken no previous anticoagulant or antiplatelet                            agents. ASA Grade Assessment: II - A patient with                            mild systemic disease. After reviewing the risks                            and benefits, the patient was deemed in  satisfactory condition to undergo the procedure.                           After obtaining informed consent, the colonoscope                            was passed under direct vision. Throughout the                            procedure, the patient's blood pressure, pulse, and                            oxygen saturations were monitored continuously. The                            Olympus PFC-[id number] (#[id number])  Colonoscope was                            introduced through the anus and advanced to the the                            terminal ileum, with identification of the                            appendiceal orifice and IC valve. The colonoscopy                            was performed without difficulty. The patient                            tolerated the procedure well. The quality of the                            bowel preparation was good. The terminal ileum,                            ileocecal valve, appendiceal orifice, and rectum                            were photographed. Scope In: 4:11:25 PM Scope Out: 4:41:21 PM Scope Withdrawal Time: 0 hours 23 minutes 0 seconds  Total Procedure Duration: 0 hours 29 minutes 56 seconds  Findings:                 The perianal and digital rectal examinations were                            normal.                           The terminal ileum appeared normal.                           A 3 mm polyp was found in the cecum. The polyp was  sessile. The polyp was removed with a cold snare.                            Resection and retrieval were complete.                           A 3 mm polyp was found in the ileocecal valve. The                            polyp was sessile. The polyp was removed with a                            cold snare. Resection and retrieval were complete.                           Two sessile polyps were found in the ascending                            colon. The polyps were 3 mm in size. These polyps                            were removed with a cold snare. Resection and                            retrieval were complete.                           A 3 mm polyp was found in the transverse colon. The                            polyp was sessile. The polyp was removed with a                            cold snare. Resection and retrieval were complete.                           Four sessile polyps were  found in the descending                            colon. The polyps were 3 to 5 mm in size. These                            polyps were removed with a cold snare. Resection                            and retrieval were complete.                           Two sessile polyps were found in the sigmoid colon.                            The polyps were 3 mm in size. These  polyps were                            removed with a cold snare. Resection and retrieval                            were complete.                           A few small-mouthed diverticula were found in the                            sigmoid colon.                           Internal hemorrhoids were found during retroflexion.                           The exam was otherwise without abnormality.                           Biopsies for histology were taken with a cold                            forceps from the right colon, left colon and                            transverse colon for evaluation of microscopic                            colitis. Complications:            No immediate complications. Estimated blood loss:                            Minimal. Estimated Blood Loss:     Estimated blood loss was minimal. Impression:               - The examined portion of the ileum was normal.                           - One 3 mm polyp in the cecum, removed with a cold                            snare. Resected and retrieved.                           - One 3 mm polyp at the ileocecal valve, removed                            with a cold snare. Resected and retrieved.                           - Two 3 mm polyps in the ascending colon, removed  with a cold snare. Resected and retrieved.                           - One 3 mm polyp in the transverse colon, removed                            with a cold snare. Resected and retrieved.                           - Four 3 to 5 mm polyps in the descending colon,                             removed with a cold snare. Resected and retrieved.                           - Two 3 mm polyps in the sigmoid colon, removed                            with a cold snare. Resected and retrieved.                           - Diverticulosis in the sigmoid colon.                           - Internal hemorrhoids.                           - The examination was otherwise normal.                           - Biopsies were taken with a cold forceps from the                            right colon, left colon and transverse colon for                            evaluation of microscopic colitis. Recommendation:           - Patient has a contact number available for                            emergencies. The signs and symptoms of potential                            delayed complications were discussed with the                            patient. Return to normal activities tomorrow.                            Written discharge instructions were provided to the                            patient.                           -  Resume previous diet.                           - Continue present medications.                           - Take immodium every AM and titrate up as needed                           - Await pathology results with further                            recommendations Remo Lipps P. Dwayna Kentner, MD 12/23/2020 4:47:48 PM This report has been signed electronically.

## 2020-12-27 ENCOUNTER — Telehealth: Payer: Self-pay

## 2020-12-27 NOTE — Telephone Encounter (Signed)
  Follow up Call-  Call back number 12/23/2020  Post procedure Call Back phone  # [phone]  Permission to leave phone message Yes  Some recent data might be hidden     Patient questions:  Do you have a fever, pain , or abdominal swelling? No. Pain Score  0 *  Have you tolerated food without any problems? Yes.    Have you been able to return to your normal activities? Yes.    Do you have any questions about your discharge instructions: Diet   No. Medications  No. Follow up visit  No.  Do you have questions or concerns about your Care? No.  Actions: * If pain score is 4 or above: No action needed, pain <4.  1. Have you developed a fever since your procedure? no  2.   Have you had an respiratory symptoms (SOB or cough) since your procedure? no  3.   Have you tested positive for COVID 19 since your procedure no  4.   Have you had any family members/close contacts diagnosed with the COVID 19 since your procedure?  no   If yes to any of these questions please route to Joylene John, RN and Joella Prince, RN

## 2021-01-04 ENCOUNTER — Encounter: Payer: Self-pay | Admitting: Gastroenterology

## 2021-04-07 ENCOUNTER — Other Ambulatory Visit (HOSPITAL_COMMUNITY): Payer: Self-pay | Admitting: Internal Medicine

## 2021-04-07 DIAGNOSIS — I739 Peripheral vascular disease, unspecified: Secondary | ICD-10-CM

## 2021-04-10 ENCOUNTER — Ambulatory Visit (HOSPITAL_COMMUNITY)
Admission: RE | Admit: 2021-04-10 | Discharge: 2021-04-10 | Disposition: A | Discharge status: Home / Self Care | Payer: Medicare Other | Source: Ambulatory Visit | Attending: Internal Medicine | Admitting: Internal Medicine

## 2021-04-10 ENCOUNTER — Other Ambulatory Visit: Payer: Self-pay

## 2021-04-10 DIAGNOSIS — I739 Peripheral vascular disease, unspecified: Secondary | ICD-10-CM | POA: Diagnosis not present

## 2022-02-01 DIAGNOSIS — H6123 Impacted cerumen, bilateral: Secondary | ICD-10-CM | POA: Insufficient documentation

## 2022-11-02 ENCOUNTER — Ambulatory Visit
Admission: EM | Admit: 2022-11-02 | Discharge: 2022-11-02 | Disposition: A | Discharge status: Home / Self Care | Payer: Medicare Other | Attending: Internal Medicine | Admitting: Internal Medicine

## 2022-11-02 ENCOUNTER — Ambulatory Visit (INDEPENDENT_AMBULATORY_CARE_PROVIDER_SITE_OTHER): Payer: Medicare Other

## 2022-11-02 DIAGNOSIS — M25511 Pain in right shoulder: Secondary | ICD-10-CM | POA: Diagnosis not present

## 2022-11-02 MED ORDER — LIDOCAINE 5 % EX PTCH: 1.0000 | MEDICATED_PATCH | CUTANEOUS | 0 refills | Status: AC

## 2022-11-02 NOTE — ED Triage Notes (Signed)
Pt presents with right shoulder pain with no known injury.

## 2022-11-02 NOTE — Discharge Instructions (Signed)
X-ray was normal.  I have prescribed a pain patch to apply to the area.  Follow-up with orthopedist at provided contact information to schedule an appointment for further evaluation and management.

## 2022-11-02 NOTE — ED Provider Notes (Signed)
[facility]    CSN: [id number] Arrival date & time: 11/02/22  1209      History   Chief Complaint Chief Complaint  Patient presents with   Shoulder Pain    HPI Dawn Whitney is a 82 y.o. female.   Patient presents with right shoulder pain that started a few weeks prior.  Patient denies any obvious injury to the area.  Has not taken any medication for pain.  Denies numbness or tingling.  Denies history of chronic shoulder pain.   Shoulder Pain   Past Medical History:  Diagnosis Date   Hyperlipidemia    Hypertension    Thyroid disease     Patient Active Problem List   Diagnosis Date Noted   Diarrhea 11/01/2020   Loss of weight 11/01/2020   Change in bowel habits 11/01/2020    History reviewed. No pertinent surgical history.  OB History   No obstetric history on file.      Home Medications    Prior to Admission medications   Medication Sig Start Date End Date Taking? Authorizing Provider  lidocaine (LIDODERM) 5 % Place 1 patch onto the skin daily. Remove & Discard patch within 12 hours or as directed by MD 11/02/22  Yes Teodora Medici, FNP  ALPRAZolam (XANAX) 0.25 MG tablet TAKE 1 TABLET BY MOUTH AT BEDTIME AND TAKE 1 TABLET DURING THE DAY AS NEEDED (30 DAY SUPPLY) 08/01/20   [provider]  atenolol (TENORMIN) 50 MG tablet Take 50 mg by mouth daily. 08/14/20   [provider]  buPROPion (WELLBUTRIN XL) 150 MG 24 hr tablet 1 tablet in the morning 11/20/19   [provider]  diphenoxylate-atropine (LOMOTIL) 2.5-0.025 MG tablet Take 1 tablet by mouth every 8 (eight) hours as needed for diarrhea or loose stools. Patient not taking: Reported on 12/23/2020 11/01/20   Zehr, Laban Emperor, PA-C  hydrochlorothiazide (HYDRODIURIL) 25 MG tablet Take 25 mg by mouth every morning. 08/14/20   [provider]  levothyroxine (SYNTHROID) 150 MCG tablet SMARTSIG:1 Tablet(s) By Mouth 6 Times a Week 10/07/20   [provider]   rosuvastatin (CRESTOR) 20 MG tablet Take 20 mg by mouth daily. 10/07/20   [provider]  traZODone (DESYREL) 150 MG tablet Take 150 mg by mouth at bedtime as needed. 08/14/20   [provider]    Family History Family History  Problem Relation Age of Onset   Lung cancer Sister    Colon cancer Neg Hx    Stomach cancer Neg Hx    Rectal cancer Neg Hx    Esophageal cancer Neg Hx     Social History Social History   Tobacco Use   Smoking status: Never  Substance Use Topics   Alcohol use: Never   Drug use: Never     Allergies   Patient has no known allergies.   Review of Systems Review of Systems Per HPI  Physical Exam Triage Vital Signs ED Triage Vitals  Enc Vitals Group     BP 11/02/22 1233 (!) 127/54     Pulse Rate 11/02/22 1233 (!) 52     Resp 11/02/22 1233 17     Temp 11/02/22 1233 98.1 F (36.7 C)     Temp Source 11/02/22 1233 Oral     SpO2 11/02/22 1233 93 %     Weight --      Height --      Head Circumference --      Peak Flow --  Pain Score 11/02/22 1236 7     Pain Loc --      Pain Edu? --      Excl. in [place]? --    No data found.  Updated Vital Signs BP (!) 127/54 (BP Location: Left Arm)   Pulse (!) 52   Temp 98.1 F (36.7 C) (Oral)   Resp 17   SpO2 93%   Visual Acuity Right Eye Distance:   Left Eye Distance:   Bilateral Distance:    Right Eye Near:   Left Eye Near:    Bilateral Near:     Physical Exam Constitutional:      General: She is not in acute distress.    Appearance: Normal appearance. She is not toxic-appearing or diaphoretic.  HENT:     Head: Normocephalic and atraumatic.  Eyes:     Extraocular Movements: Extraocular movements intact.     Conjunctiva/sclera: Conjunctivae normal.  Pulmonary:     Effort: Pulmonary effort is normal.  Musculoskeletal:     Comments: There is no tenderness to palpation throughout affected area.  Patient reports that pain is present in the lateral portion of the shoulder  and radiates slightly down.  No discoloration or swelling noted.  Pain with abduction of arm at 45 degrees.  Grip strength is 5/5.  Patient is neurovascularly intact.  Neurological:     General: No focal deficit present.     Mental Status: She is alert and oriented to person, place, and time. Mental status is at baseline.  Psychiatric:        Mood and Affect: Mood normal.        Behavior: Behavior normal.        Thought Content: Thought content normal.        Judgment: Judgment normal.      UC Treatments / Results  Labs (all labs ordered are listed, but only abnormal results are displayed) Labs Reviewed - No data to display  EKG   Radiology DG Shoulder Right  Result Date: 11/02/2022 CLINICAL DATA:  Atraumatic right shoulder pain EXAM: RIGHT SHOULDER - 2 VIEW COMPARISON:  None Available. FINDINGS: There is no evidence of fracture or dislocation. No significant spurring or joint space narrowing noted. Subjective osteopenia. IMPRESSION: No acute finding or notable spurring. Electronically Signed   By: Jorje Guild M.D.   On: 11/02/2022 12:55    Procedures Procedures (including critical care time)  Medications Ordered in UC Medications - No data to display  Initial Impression / Assessment and Plan / UC Course  I have reviewed the triage vital signs and the nursing notes.  Pertinent labs & imaging results that were available during my care of the patient were reviewed by me and considered in my medical decision making (see chart for details).     Right shoulder x-ray was negative for any acute bony abnormality.  Suspect muscular inflammation/injury.  Will treat with lidocaine patch given the patient is not able to take NSAIDs and prednisone is risky.  Advised supportive care as well.  Patient advised to follow-up with provided contact information for orthopedist for further evaluation and management.  Patient is mildly bradycardic and oxygen saturation is low normal but after  further review of the chart, this appears baseline for patient so do not think that any worrisome etiology or need for further workup is necessary for this at this time.  Patient is not in any distress.  Patient verbalized understanding and was agreeable with plan. Final Clinical Impressions(s) /  UC Diagnoses   Final diagnoses:  Acute pain of right shoulder     Discharge Instructions      X-ray was normal.  I have prescribed a pain patch to apply to the area.  Follow-up with orthopedist at provided contact information to schedule an appointment for further evaluation and management.     ED Prescriptions     Medication Sig Dispense Auth. Provider   lidocaine (LIDODERM) 5 % Place 1 patch onto the skin daily. Remove & Discard patch within 12 hours or as directed by MD 30 patch [place], Michele Rockers, FNP      PDMP not reviewed this encounter.   Teodora Medici, [place] 11/02/22 [phone]

## 2023-07-03 ENCOUNTER — Other Ambulatory Visit: Payer: Self-pay

## 2023-07-03 ENCOUNTER — Ambulatory Visit
Admission: EM | Admit: 2023-07-03 | Discharge: 2023-07-03 | Disposition: A | Discharge status: Home / Self Care | Payer: Medicare Other

## 2023-07-03 ENCOUNTER — Encounter: Payer: Self-pay | Admitting: *Deleted

## 2023-07-03 DIAGNOSIS — R339 Retention of urine, unspecified: Secondary | ICD-10-CM | POA: Diagnosis not present

## 2023-07-03 LAB — POCT URINALYSIS DIP (MANUAL ENTRY)
Bilirubin, UA: NEGATIVE
Blood, UA: NEGATIVE
Glucose, UA: NEGATIVE mg/dL
Ketones, POC UA: NEGATIVE mg/dL
Nitrite, UA: NEGATIVE
Protein Ur, POC: NEGATIVE mg/dL
Spec Grav, UA: 1.025 (ref 1.010–1.025)
Urobilinogen, UA: 0.2 U/dL
pH, UA: 5.5 (ref 5.0–8.0)

## 2023-07-03 MED ORDER — CIPROFLOXACIN HCL 500 MG PO TABS: 500.0000 mg | ORAL_TABLET | Freq: Two times a day (BID) | ORAL | 0 refills | Status: DC

## 2023-07-03 NOTE — Discharge Instructions (Addendum)
  Please follow up with PCP in near future

## 2023-07-03 NOTE — ED Triage Notes (Signed)
Pt reports she has been drinking a lot of water but has noticed that her urine has "slowed down". States she has the urge to go but when she gets to the bathroom she doesn't void as much as usual. Voices concern for this change. Denies pain, denies fever

## 2023-07-03 NOTE — ED Provider Notes (Addendum)
[facility]    CSN: [id number] Arrival date & time: 07/03/23  1025      History   Chief Complaint Chief Complaint  Patient presents with   Urinary Retention    HPI Dawn Whitney is a 82 y.o. female.   Patient here today for evaluation of decreased urination.  She reports that she feels that she has the urge to urinate more frequently but does not expel as much urine as she would expect.  She does not report fever or other symptoms.  She denies any pain or dysuria.  She has not had fever.  The history is provided by the patient.    Past Medical History:  Diagnosis Date   Hyperlipidemia    Hypertension    Thyroid disease     Patient Active Problem List   Diagnosis Date Noted   Diarrhea 11/01/2020   Loss of weight 11/01/2020   Change in bowel habits 11/01/2020    History reviewed. No pertinent surgical history.  OB History   No obstetric history on file.      Home Medications    Prior to Admission medications   Medication Sig Start Date End Date Taking? Authorizing Provider  atenolol (TENORMIN) 50 MG tablet Take 50 mg by mouth daily. 08/14/20  Yes [provider]  ciprofloxacin (CIPRO) 500 MG tablet Take 1 tablet (500 mg total) by mouth every 12 (twelve) hours. 07/03/23  Yes Tomi Bamberger, PA-C  hydrochlorothiazide (HYDRODIURIL) 25 MG tablet Take 25 mg by mouth every morning. 08/14/20  Yes [provider]  levothyroxine (SYNTHROID) 150 MCG tablet SMARTSIG:1 Tablet(s) By Mouth 6 Times a Week 10/07/20  Yes [provider]  zolpidem (AMBIEN CR) 6.25 MG CR tablet Take 6.25 mg by mouth at bedtime as needed. 06/11/23  Yes [provider]  ALPRAZolam (XANAX) 0.25 MG tablet TAKE 1 TABLET BY MOUTH AT BEDTIME AND TAKE 1 TABLET DURING THE DAY AS NEEDED (30 DAY SUPPLY) 08/01/20   [provider]  buPROPion (WELLBUTRIN XL) 150 MG 24 hr tablet 1 tablet in the morning 11/20/19   [provider]   diphenoxylate-atropine (LOMOTIL) 2.5-0.025 MG tablet Take 1 tablet by mouth every 8 (eight) hours as needed for diarrhea or loose stools. Patient not taking: Reported on 12/23/2020 11/01/20   Zehr, Shanda Bumps D, PA-C  lidocaine (LIDODERM) 5 % Place 1 patch onto the skin daily. Remove & Discard patch within 12 hours or as directed by MD 11/02/22   Gustavus Bryant, FNP  rosuvastatin (CRESTOR) 20 MG tablet Take 20 mg by mouth daily. 10/07/20   [provider]  traZODone (DESYREL) 150 MG tablet Take 150 mg by mouth at bedtime as needed. 08/14/20   [provider]    Family History Family History  Problem Relation Age of Onset   Lung cancer Sister    Colon cancer Neg Hx    Stomach cancer Neg Hx    Rectal cancer Neg Hx    Esophageal cancer Neg Hx     Social History Social History   Tobacco Use   Smoking status: Never  Substance Use Topics   Alcohol use: Never   Drug use: Never     Allergies   Patient has no known allergies.   Review of Systems Review of Systems  Constitutional:  Negative for chills and fever.  Eyes:  Negative for discharge and redness.  Respiratory:  Negative for shortness of breath.   Gastrointestinal:  Negative for abdominal pain, nausea  and vomiting.  Genitourinary:  Positive for decreased urine volume, frequency and urgency. Negative for dysuria.     Physical Exam Triage Vital Signs ED Triage Vitals  Encounter Vitals Group     BP 07/03/23 1059 (!) 159/61     Systolic BP Percentile --      Diastolic BP Percentile --      Pulse Rate 07/03/23 1059 (!) 57     Resp 07/03/23 1059 18     Temp 07/03/23 1059 97.9 F (36.6 C)     Temp Source 07/03/23 1059 Oral     SpO2 07/03/23 1059 98 %     Weight --      Height --      Head Circumference --      Peak Flow --      Pain Score 07/03/23 1045 0     Pain Loc --      Pain Education --      Exclude from Growth Chart --    No data found.  Updated Vital Signs BP (!) 159/61   Pulse (!) 57    Temp 97.9 F (36.6 C) (Oral)   Resp 18   SpO2 98%      Physical Exam Vitals and nursing note reviewed.  Constitutional:      General: She is not in acute distress.    Appearance: Normal appearance. She is not ill-appearing.  HENT:     Head: Normocephalic and atraumatic.  Eyes:     Conjunctiva/sclera: Conjunctivae normal.  Cardiovascular:     Rate and Rhythm: Normal rate.  Pulmonary:     Effort: Pulmonary effort is normal.  Neurological:     Mental Status: She is alert.  Psychiatric:        Mood and Affect: Mood normal.        Behavior: Behavior normal.        Thought Content: Thought content normal.      UC Treatments / Results  Labs (all labs ordered are listed, but only abnormal results are displayed) Labs Reviewed  POCT URINALYSIS DIP (MANUAL ENTRY) - Abnormal; Notable for the following components:      Result Value   Clarity, UA cloudy (*)    Leukocytes, UA Small (1+) (*)    All other components within normal limits    EKG   Radiology No results found.  Procedures Procedures (including critical care time)  Medications Ordered in UC Medications - No data to display  Initial Impression / Assessment and Plan / UC Course  I have reviewed the triage vital signs and the nursing notes.  Pertinent labs & imaging results that were available during my care of the patient were reviewed by me and considered in my medical decision making (see chart for details).    Leukocyte esterase positive on UA.  Will treat to cover possible infection and encouraged follow-up with PCP should symptoms not resolve or worsen.  Urine sample QNS for urine culture.  Patient expresses understanding.  Final Clinical Impressions(s) / UC Diagnoses   Final diagnoses:  Urinary retention     Discharge Instructions       Please follow up with PCP in near future     ED Prescriptions     Medication Sig Dispense Auth. Provider   ciprofloxacin (CIPRO) 500 MG tablet Take 1  tablet (500 mg total) by mouth every 12 (twelve) hours. 10 tablet Tomi Bamberger, PA-C      PDMP not reviewed this encounter.  Tomi Bamberger, PA-C 07/03/23 1222    Tomi Bamberger, PA-C 07/03/23 [phone]

## 2023-07-04 ENCOUNTER — Encounter (HOSPITAL_COMMUNITY): Payer: Self-pay

## 2023-07-04 ENCOUNTER — Emergency Department (HOSPITAL_COMMUNITY)
Admission: EM | Admit: 2023-07-04 | Discharge: 2023-07-04 | Disposition: A | Discharge status: Home / Self Care | Payer: Medicare Other | Attending: Emergency Medicine | Admitting: Emergency Medicine

## 2023-07-04 ENCOUNTER — Other Ambulatory Visit: Payer: Self-pay

## 2023-07-04 DIAGNOSIS — D72829 Elevated white blood cell count, unspecified: Secondary | ICD-10-CM | POA: Insufficient documentation

## 2023-07-04 DIAGNOSIS — N390 Urinary tract infection, site not specified: Secondary | ICD-10-CM | POA: Insufficient documentation

## 2023-07-04 DIAGNOSIS — R330 Drug induced retention of urine: Secondary | ICD-10-CM | POA: Diagnosis present

## 2023-07-04 LAB — URINALYSIS, ROUTINE W REFLEX MICROSCOPIC
Bilirubin Urine: NEGATIVE
Glucose, UA: NEGATIVE mg/dL
Hgb urine dipstick: NEGATIVE
Ketones, ur: NEGATIVE mg/dL
Nitrite: POSITIVE — AB
Protein, ur: NEGATIVE mg/dL
Specific Gravity, Urine: 1.012 (ref 1.005–1.030)
pH: 6 (ref 5.0–8.0)

## 2023-07-04 LAB — CBC
HCT: 36.2 % (ref 36.0–46.0)
Hemoglobin: 11.8 g/dL — ABNORMAL LOW (ref 12.0–15.0)
MCH: 29.8 pg (ref 26.0–34.0)
MCHC: 32.6 g/dL (ref 30.0–36.0)
MCV: 91.4 fL (ref 80.0–100.0)
Platelets: 200 10*3/uL (ref 150–400)
RBC: 3.96 MIL/uL (ref 3.87–5.11)
RDW: 13.7 % (ref 11.5–15.5)
WBC: 7.8 10*3/uL (ref 4.0–10.5)
nRBC: 0 % (ref 0.0–0.2)

## 2023-07-04 LAB — MAGNESIUM: Magnesium: 1.8 mg/dL (ref 1.7–2.4)

## 2023-07-04 LAB — BASIC METABOLIC PANEL
Anion gap: 11 (ref 5–15)
BUN: 14 mg/dL (ref 8–23)
CO2: 28 mmol/L (ref 22–32)
Calcium: 9 mg/dL (ref 8.9–10.3)
Chloride: 96 mmol/L — ABNORMAL LOW (ref 98–111)
Creatinine, Ser: 1.09 mg/dL — ABNORMAL HIGH (ref 0.44–1.00)
GFR, Estimated: 51 mL/min — ABNORMAL LOW (ref >60)
Glucose, Bld: 104 mg/dL — ABNORMAL HIGH (ref 70–99)
Potassium: 2.5 mmol/L — CL (ref 3.5–5.1)
Sodium: 135 mmol/L (ref 135–145)

## 2023-07-04 MED ORDER — CEPHALEXIN 500 MG PO CAPS
500.0000 mg | ORAL_CAPSULE | Freq: Once | ORAL | Status: AC
  Administered 2023-07-04: 500 mg via ORAL
  Filled 2023-07-04: qty 1

## 2023-07-04 MED ORDER — CEPHALEXIN 500 MG PO CAPS: 500.0000 mg | ORAL_CAPSULE | Freq: Four times a day (QID) | ORAL | 0 refills | Status: DC

## 2023-07-04 MED ORDER — POTASSIUM CHLORIDE CRYS ER 20 MEQ PO TBCR
40.0000 meq | EXTENDED_RELEASE_TABLET | Freq: Once | ORAL | Status: AC
  Administered 2023-07-04: 40 meq via ORAL
  Filled 2023-07-04: qty 2

## 2023-07-04 MED ORDER — POTASSIUM CHLORIDE CRYS ER 20 MEQ PO TBCR: 20.0000 meq | EXTENDED_RELEASE_TABLET | Freq: Two times a day (BID) | ORAL | 0 refills | Status: AC

## 2023-07-04 NOTE — ED Provider Notes (Signed)
[place] EMERGENCY DEPARTMENT AT [facility] Provider Note   CSN: [id number] Arrival date & time: 07/04/23  1655     History Chief Complaint  Patient presents with   Urinary Retention    HPI Dawn Whitney is a 82 y.o. female presenting for inability to urinate.  States she gets the urge to go and when she tries nothing comes out.  No history of similar.  G2, P2 with a cesarean section on her second birth.  Started approximately 2 days ago.  No history of similar. Denies fevers chills nausea vomiting syncope or shortness of breath.    Patient's recorded medical, surgical, social, medication list and allergies were reviewed in the Snapshot window as part of the initial history.   Review of Systems   Review of Systems  Constitutional:  Negative for chills and fever.  HENT:  Negative for ear pain and sore throat.   Eyes:  Negative for pain and visual disturbance.  Respiratory:  Negative for cough and shortness of breath.   Cardiovascular:  Negative for chest pain and palpitations.  Gastrointestinal:  Negative for abdominal pain and vomiting.  Genitourinary:  Positive for decreased urine volume. Negative for dysuria and hematuria.  Musculoskeletal:  Negative for arthralgias and back pain.  Skin:  Negative for color change and rash.  Neurological:  Negative for seizures and syncope.  All other systems reviewed and are negative.   Physical Exam Updated Vital Signs BP (!) 149/59   Pulse (!) 55   Temp 98.5 F (36.9 C) (Oral)   Resp 19   Ht 5\' 5"  (1.651 m)   Wt 61.2 kg   SpO2 95%   BMI 22.47 kg/m  Physical Exam Vitals and nursing note reviewed.  Constitutional:      General: She is not in acute distress.    Appearance: She is well-developed.  HENT:     Head: Normocephalic and atraumatic.  Eyes:     Conjunctiva/sclera: Conjunctivae normal.  Cardiovascular:     Rate and Rhythm: Normal rate and regular rhythm.     Heart sounds: No murmur  heard. Pulmonary:     Effort: Pulmonary effort is normal. No respiratory distress.     Breath sounds: Normal breath sounds.  Abdominal:     General: There is no distension.     Palpations: Abdomen is soft.     Tenderness: There is no abdominal tenderness. There is no right CVA tenderness or left CVA tenderness.  Musculoskeletal:        General: No swelling or tenderness. Normal range of motion.     Cervical back: Neck supple.  Skin:    General: Skin is warm and dry.  Neurological:     General: No focal deficit present.     Mental Status: She is alert and oriented to person, place, and time. Mental status is at baseline.     Cranial Nerves: No cranial nerve deficit.      ED Course/ Medical Decision Making/ A&P    Procedures Procedures   Medications Ordered in ED Medications  potassium chloride SA (KLOR-CON M) CR tablet 40 mEq (40 mEq Oral Given 07/04/23 2101)  cephALEXin (KEFLEX) capsule 500 mg (500 mg Oral Given 07/04/23 2154)    Medical Decision Making:    Dawn Whitney is a 82 y.o. female who presented to the ED today with inability to urinate detailed above.     Patient placed on continuous vitals and telemetry monitoring while in ED which was  reviewed periodically.   Complete initial physical exam performed, notably the patient  was with a distended bladder.  Perform point-of-care ultrasound that demonstrated approximately 200 cc in the bladder.      Reviewed and confirmed nursing documentation for past medical history, family history, social history.    Initial Assessment:   Patient's history of present illness mental exam findings are most consistent with urinary retention of uncertain etiology.  Given the prodrome of some vague dysuria symptoms likely UTI that triggered the event though pelvic organ prolapse or other emergent pathology is on the differential. Treated with Foley catheterization in the emergency room with plan to test for infectious  etiology.  Reassessment and Plan:   Urine with signs of infection given white blood cells, leukocytes, nitrate positive and bacteria positive. Will treat with Keflex given otherwise hemodynamic stability.  Potassium low and repleted in the emergency room likely due to her outpatient hydrochlorothiazide given no other evidence of AKI. Will refer to urology for reassessment of removal of catheter, PCP for interval follow-up of hypokalemia and UTI.   Disposition:  I have considered need for hospitalization, however, considering all of the above, I believe this patient is stable for discharge at this time.  Patient/family educated about specific return precautions for given chief complaint and symptoms.  Patient/family educated about follow-up with PCP and urology.     Patient/family expressed understanding of return precautions and need for follow-up. Patient spoken to regarding all imaging and laboratory results and appropriate follow up for these results. All education provided in verbal form with additional information in written form. Time was allowed for answering of patient questions. Patient discharged.    Emergency Department Medication Summary:   Medications  potassium chloride SA (KLOR-CON M) CR tablet 40 mEq (40 mEq Oral Given 07/04/23 2101)  cephALEXin (KEFLEX) capsule 500 mg (500 mg Oral Given 07/04/23 2154)         Clinical Impression:  1. Lower urinary tract infectious disease      Discharge   Final Clinical Impression(s) / ED Diagnoses Final diagnoses:  Lower urinary tract infectious disease    Rx / DC Orders ED Discharge Orders          Ordered    cephALEXin (KEFLEX) 500 MG capsule  4 times daily        07/04/23 2149    potassium chloride SA (KLOR-CON M) 20 MEQ tablet  2 times daily        07/04/23 2150              Glyn Ade, MD 07/04/23 2212

## 2023-07-04 NOTE — ED Triage Notes (Signed)
Patient reports minimal urine output x 4 days. Says she only dribbles in the AM. Denies fevers, pelvic, and abdominal pain. Seen at [facility] and started on abx.

## 2023-07-04 NOTE — ED Notes (Signed)
AVS summary provided by edp was discussed with pt. Pt verbalized understanding with no additional questions at this time.   Pt going home with foley cath at this time. Demonstrated use of foley to pt and pts daughter at bedside. Pt was able to demonstrate use and verbalize care for foley.

## 2023-07-08 NOTE — Plan of Care (Signed)
CHL Tonsillectomy/Adenoidectomy, Postoperative PEDS care plan entered in error.

## 2023-11-25 ENCOUNTER — Ambulatory Visit: Payer: Medicare Other | Admitting: Gastroenterology

## 2024-01-22 ENCOUNTER — Ambulatory Visit: Admitting: Physician Assistant

## 2024-01-22 ENCOUNTER — Encounter: Payer: Self-pay | Admitting: Physician Assistant

## 2024-01-22 VITALS — BP 122/62 | HR 64 | Ht 65.0 in | Wt 132.0 lb

## 2024-01-22 DIAGNOSIS — R634 Abnormal weight loss: Secondary | ICD-10-CM | POA: Diagnosis not present

## 2024-01-22 DIAGNOSIS — Z860101 Personal history of adenomatous and serrated colon polyps: Secondary | ICD-10-CM

## 2024-01-22 DIAGNOSIS — R197 Diarrhea, unspecified: Secondary | ICD-10-CM | POA: Diagnosis not present

## 2024-01-22 NOTE — Progress Notes (Signed)
 Chief Complaint: Recurrent diarrhea and weight loss  HPI:    Dawn Whitney is an 83 year old female with a past medical history as listed below, known to Dr. General Kenner, who was referred to me by Azalia Leo, MD for a complaint of recurrent diarrhea and weight loss.      12/23/20 colonoscopy done for clinically significant diarrhea with a total of 11 polyps throughout the colon, diverticulosis in the sigmoid colon and internal hemorrhoids.  At that point told to take Imodium every morning and titrate up as needed.  Biopsies were negative for microscopic colitis.  Patient told to return to office in a year to discuss repeat colonoscopy.    09/20/2023 potassium 2.7 and otherwise normal, CBC with a hemoglobin of 11.9 (12.7 on 08/14/2022).    09/24/2023 patient seen by PCP for weight loss.  Also intermittent diarrhea, 1-2 loose stools per day.    Today, patient presents to clinic accompanied by her daughter and explains that back in January she was having issues with recurrent diarrhea noting at least 2 loose stools a day.  Also had no appetite and was starting to lose some weight.  Since then though all of her symptoms have gone away, she is starting to gain weight back and is eating well.  She is very happy currently and tells me "I feel the best I have in years".    Her daughter does asked some questions about her mom's history of polyps.    Denies fever, chills, continued weight loss or blood in her stool.      Past Medical History:  Diagnosis Date   Hyperlipidemia    Hypertension    Thyroid  disease     No past surgical history on file.  Current Outpatient Medications  Medication Sig Dispense Refill   ALPRAZolam (XANAX) 0.25 MG tablet TAKE 1 TABLET BY MOUTH AT BEDTIME AND TAKE 1 TABLET DURING THE DAY AS NEEDED (30 DAY SUPPLY)     atenolol (TENORMIN) 50 MG tablet Take 50 mg by mouth daily.     buPROPion (WELLBUTRIN XL) 150 MG 24 hr tablet 1 tablet in the morning     cephALEXin  (KEFLEX ) 500 MG  capsule Take 1 capsule (500 mg total) by mouth 4 (four) times daily. 20 capsule 0   ciprofloxacin  (CIPRO ) 500 MG tablet Take 1 tablet (500 mg total) by mouth every 12 (twelve) hours. 10 tablet 0   diphenoxylate -atropine  (LOMOTIL ) 2.5-0.025 MG tablet Take 1 tablet by mouth every 8 (eight) hours as needed for diarrhea or loose stools. (Patient not taking: Reported on 12/23/2020) 30 tablet 1   hydrochlorothiazide (HYDRODIURIL) 25 MG tablet Take 25 mg by mouth every morning.     levothyroxine (SYNTHROID) 150 MCG tablet SMARTSIG:1 Tablet(s) By Mouth 6 Times a Week     lidocaine  (LIDODERM ) 5 % Place 1 patch onto the skin daily. Remove & Discard patch within 12 hours or as directed by MD 30 patch 0   potassium chloride  SA (KLOR-CON  M) 20 MEQ tablet Take 1 tablet (20 mEq total) by mouth 2 (two) times daily. 5 tablet 0   rosuvastatin (CRESTOR) 20 MG tablet Take 20 mg by mouth daily.     traZODone (DESYREL) 150 MG tablet Take 150 mg by mouth at bedtime as needed.     zolpidem (AMBIEN CR) 6.25 MG CR tablet Take 6.25 mg by mouth at bedtime as needed.     No current facility-administered medications for this visit.    Allergies as of 01/22/2024   (  No Known Allergies)    Family History  Problem Relation Age of Onset   Lung cancer Sister    Colon cancer Neg Hx    Stomach cancer Neg Hx    Rectal cancer Neg Hx    Esophageal cancer Neg Hx     Social History   Socioeconomic History   Marital status: Widowed    Spouse name: Not on file   Number of children: Not on file   Years of education: Not on file   Highest education level: Not on file  Occupational History   Not on file  Tobacco Use   Smoking status: Never   Smokeless tobacco: Not on file  Substance and Sexual Activity   Alcohol use: Never   Drug use: Never   Sexual activity: Not on file  Other Topics Concern   Not on file  Social History Narrative   Not on file   Social Drivers of Health   Financial Resource Strain: Not on file   Food Insecurity: Not on file  Transportation Needs: Not on file  Physical Activity: Not on file  Stress: Not on file  Social Connections: Not on file  Intimate Partner Violence: Not on file    Review of Systems:    Constitutional: No weight loss, fever or chills Skin: No rash  Cardiovascular: No chest pain   Respiratory: No SOB  Gastrointestinal: See HPI and otherwise negative Genitourinary: No dysuria Neurological: No headache, dizziness or syncope Musculoskeletal: No new muscle or joint pain Hematologic: No bleeding  Psychiatric: No history of depression or anxiety   Physical Exam:  Vital signs: BP 122/62   Pulse 64   Ht 5\' 5"  (1.651 m)   Wt 132 lb (59.9 kg)   BMI 21.97 kg/m    Constitutional:   Pleasant Elderly Caucasian female appears to be in NAD, Well developed, Well nourished, alert and cooperative Head:  Normocephalic and atraumatic. Eyes:   PEERL, EOMI. No icterus. Conjunctiva pink. Ears:  Normal auditory acuity. Neck:  Supple Throat: Oral cavity and pharynx without inflammation, swelling or lesion.  Respiratory: Respirations even and unlabored. Lungs clear to auscultation bilaterally.   No wheezes, crackles, or rhonchi.  Cardiovascular: Normal S1, S2. No MRG. Regular rate and rhythm. No peripheral edema, cyanosis or pallor.  Gastrointestinal:  Soft, nondistended, nontender. No rebound or guarding. Normal bowel sounds. No appreciable masses or hepatomegaly. Rectal:  Not performed.  Msk:  Symmetrical without gross deformities. Without edema, no deformity or joint abnormality.  Neurologic:  Alert and  oriented x4;  grossly normal neurologically.  Skin:   Dry and intact without significant lesions or rashes. Psychiatric: Demonstrates good judgement and reason without abnormal affect or behaviors.  RELEVANT LABS AND IMAGING: CBC    Component Value Date/Time   WBC 7.8 07/04/2023 1905   RBC 3.96 07/04/2023 1905   HGB 11.8 (L) 07/04/2023 1905   HCT 36.2  07/04/2023 1905   PLT 200 07/04/2023 1905   MCV 91.4 07/04/2023 1905   MCH 29.8 07/04/2023 1905   MCHC 32.6 07/04/2023 1905   RDW 13.7 07/04/2023 1905   LYMPHSABS 2.9 11/01/2020 1439   MONOABS 0.8 11/01/2020 1439   EOSABS 0.2 11/01/2020 1439   BASOSABS 0.1 11/01/2020 1439    CMP     Component Value Date/Time   NA 135 07/04/2023 1905   K 2.5 (LL) 07/04/2023 1905   CL 96 (L) 07/04/2023 1905   CO2 28 07/04/2023 1905   GLUCOSE 104 (H) 07/04/2023 1905  BUN 14 07/04/2023 1905   CREATININE 1.09 (H) 07/04/2023 1905   CALCIUM 9.0 07/04/2023 1905   GFRNONAA 51 (L) 07/04/2023 1905    Assessment: 1.  Diarrhea: Previously evaluated in 2022 with colonoscopy, no microscopic colitis, most recently recurred at the end of 2024, no longer having symptoms over the past 4 months; consider infectious versus inflammatory versus IBS 2.  Weight loss: No longer losing weight and has regained appetite 3.  History of multiple adenomatous polyps: 11 polyps total removed in 2022, recommendations to discuss a repeat colonoscopy in a year from then  Plan: 1.  Patient is doing well now.  She does not wish any further workup for her diarrhea or weight loss. 2.  Discussed history of polyps.  She does not want to pursue a colonoscopy. 3.  Patient to follow in clinic with us  as needed.  Reginal Capra, PA-C [facility] 01/22/2024, 2:55 PM  Cc: Azalia Leo, MD

## 2024-01-23 NOTE — Progress Notes (Signed)
 Agree with assessment and plan as outlined.

## 2024-08-18 ENCOUNTER — Ambulatory Visit

## 2024-08-18 VITALS — BP 120/79 | Temp 97.6°F | Resp 16 | Ht 65.0 in | Wt 155.8 lb

## 2024-08-18 DIAGNOSIS — G4700 Insomnia, unspecified: Secondary | ICD-10-CM

## 2024-08-18 DIAGNOSIS — Z7689 Persons encountering health services in other specified circumstances: Secondary | ICD-10-CM

## 2024-08-18 MED ORDER — ZOLPIDEM TARTRATE 1.75 MG SL SUBL: 1.7500 mg | SUBLINGUAL_TABLET | Freq: Every day | SUBLINGUAL | 0 refills | Status: DC

## 2024-08-18 NOTE — Progress Notes (Unsigned)
     Patient ID: Dawn Whitney, female    DOB: [birth year]  MRN: [id number]  CC: Establish Care (Want a new doctor)   Subjective: Kaelan Emami is a 83 y.o. female with past medical history of insomnia who presents to clinic to establish care. Patient reports that she goes to sleep around 11pm but continues to wake up around 3 am every night and has difficulty falling back asleep. Reports she has always had trouble sleeping since her teenage years.   Pt currently takes zilpidem 6.25mg  tablet before bed time.   No Known Allergies  ROS: Review of Systems Negative except as stated above  PHYSICAL EXAM: BP 120/79   Temp 97.6 F (36.4 C)   Resp 16   Ht 5' 5 (1.651 m)   Wt 155 lb 12.8 oz (70.7 kg)   SpO2 95%   BMI 25.93 kg/m   Physical Exam  General: well-appearing, no acute distress Skin: no jaundice, rashes, or lesions Cardiovascular: regular heart rate and rhythm, normal S1/S2, no murmurs, gallops, or rubs, peripheral pulses 2+ bilaterally Chest: lungs clear to auscultation bilaterally, equal breath sounds bilaterally Musculoskeletal: normal gait Extremities: no peripheral edema  ASSESSMENT AND PLAN:  1. Encounter to establish care with new provider (Primary)  2. Insomnia, unspecified type - Discussion with patient regarding current zolpidem  dose.  Explained to patient that she is currently at max recommended dose which is 6.25mg  on the extended release.  Explained to patient that increasing medication could cause symptoms such as drowsiness, dizziness, and sleepiness which could all increase her risk of falls.  Patient expressed understanding. Despite recommendations to stay at current dose patient requested something to help her sleep past 3 AM.  Patient agrees with plan to add fast acting 1.75 zolpidem  sublingual to be taken if she wakes up in the middle of the night.  Plan to follow-up in 1 month to reassess. - Start Zolpidem  Tartrate 1.75 MG SUBL; Place 1.75 mg under the  tongue daily.  Dispense: 30 tablet; Refill: 0 - Plan to recheck TSH and free T4    Patient was given the opportunity to ask questions.  Patient verbalized understanding of the plan and was able to repeat key elements of the plan.    No orders of the defined types were placed in this encounter.    Requested Prescriptions   Signed Prescriptions Disp Refills   Zolpidem  Tartrate 1.75 MG SUBL 30 tablet 0    Sig: Place 1.75 mg under the tongue daily.    Return in about 1 month (around 09/18/2024) for  follow up sleep.  Sula Leavy Rode, PA-C

## 2024-09-21 ENCOUNTER — Ambulatory Visit

## 2024-09-21 VITALS — BP 154/72 | HR 60 | Temp 97.8°F | Resp 16 | Wt 157.8 lb

## 2024-09-21 DIAGNOSIS — R34 Anuria and oliguria: Secondary | ICD-10-CM

## 2024-09-21 DIAGNOSIS — E039 Hypothyroidism, unspecified: Secondary | ICD-10-CM | POA: Diagnosis not present

## 2024-09-21 DIAGNOSIS — H6123 Impacted cerumen, bilateral: Secondary | ICD-10-CM | POA: Diagnosis not present

## 2024-09-21 DIAGNOSIS — G4700 Insomnia, unspecified: Secondary | ICD-10-CM

## 2024-09-21 MED ORDER — ZOLPIDEM TARTRATE 1.75 MG SL SUBL: 1.7500 mg | SUBLINGUAL_TABLET | Freq: Every day | SUBLINGUAL | 2 refills | Status: AC

## 2024-09-21 NOTE — Progress Notes (Signed)
" ° ° ° °  Patient ID: Dawn Whitney, female    DOB: 1941-06-08  MRN: [id number]  CC: Medical Management of Chronic Issues (Patient is concern about her weight gain.)   Subjective: Dawn Whitney is a 84 y.o. female with past medical history of hypothyroidism who presents to clinic for follow-up.  Patient reports that sleep habits have improved since the addition of sublingual zolpidem .  Patient is concerned that she urinates a lot in the morning and very little throughout the day despite drinking a lot of fluids.  Denies increased thirst.  Patient reports difficulty hearing out of both ears, she usually has to ask people surrounding her to increase her talking volume in order to hear them.  History of cerumen impaction.     Allergies[1]  ROS: Review of Systems Negative except as stated above  PHYSICAL EXAM: BP (!) 154/72   Pulse 60   Temp 97.8 F (36.6 C) (Oral)   Resp 16   Wt 157 lb 12.8 oz (71.6 kg)   SpO2 95%   BMI 26.26 kg/m   Physical Exam  General: well-appearing, no acute distress Skin: no jaundice, rashes, or lesions Ears: bilateral cerumen impaction, unable to visualize tympanic membranes Cardiovascular: regular heart rate and rhythm, normal S1/S2, no murmurs, gallops, or rubs, peripheral pulses 2+ bilaterally Chest: no skeletal deformity, lungs clear to auscultation bilaterally, equal breath sounds bilaterally Musculoskeletal: normal gait Extremities: no peripheral edema  ASSESSMENT AND PLAN:  1. Hypothyroidism, unspecified type (Primary) - TSH+T4F+T3Free - Continue levothyroxine 150 mcg tablet daily - Patient expressed concern about weight gain.  Discussed with patient that 2 pound weight gain since last visit is not a concerning amount.  2. Insomnia, unspecified type Stable  - Continue Zolpidem  Tartrate 1.75 MG SUBL; Place 1.75 mg under the tongue daily.  Dispense: 30 tablet; Refill: 2  3. Bilateral impacted cerumen - Ear Lavage scheduled - Patient declined  referral to audiology for potential hearing aids  4. Decreased urination - Basic metabolic panel with GFR - Lengthy discussion regarding patient's urinary habits.  Patient is urination seems to correlate with amount of fluid intake throughout the day and not a concern at this point  Patient was given the opportunity to ask questions.  Patient verbalized understanding of the plan and was able to repeat key elements of the plan.    Orders Placed This Encounter  Procedures   TSH+T4F+T3Free   Basic metabolic panel with GFR   Ear Lavage     Requested Prescriptions   Signed Prescriptions Disp Refills   Zolpidem  Tartrate 1.75 MG SUBL 30 tablet 2    Sig: Place 1.75 mg under the tongue daily.    Return in about 1 month (around 10/22/2024) for follow-up.  Sula Cower Nestor Wieneke, PA-C      [1] No Known Allergies  "

## 2024-09-22 LAB — BASIC METABOLIC PANEL WITH GFR
BUN/Creatinine Ratio: 15 (ref 12–28)
BUN: 19 mg/dL (ref 8–27)
CO2: 23 mmol/L (ref 20–29)
Calcium: 9.1 mg/dL (ref 8.7–10.3)
Chloride: 102 mmol/L (ref 96–106)
Creatinine, Ser: 1.3 mg/dL — ABNORMAL HIGH (ref 0.57–1.00)
Glucose: 143 mg/dL — ABNORMAL HIGH (ref 70–99)
Potassium: 3.7 mmol/L (ref 3.5–5.2)
Sodium: 139 mmol/L (ref 134–144)
eGFR: 41 mL/min/1.73 — ABNORMAL LOW

## 2024-09-22 LAB — TSH+T4F+T3FREE
Free T4: 0.71 ng/dL — ABNORMAL LOW (ref 0.82–1.77)
T3, Free: 1.8 pg/mL — ABNORMAL LOW (ref 2.0–4.4)
TSH: 39.8 u[IU]/mL — ABNORMAL HIGH (ref 0.450–4.500)

## 2024-10-08 ENCOUNTER — Other Ambulatory Visit: Payer: Self-pay

## 2024-10-08 ENCOUNTER — Telehealth: Payer: Self-pay

## 2024-10-08 ENCOUNTER — Ambulatory Visit: Payer: Self-pay

## 2024-10-08 DIAGNOSIS — G4700 Insomnia, unspecified: Secondary | ICD-10-CM

## 2024-10-08 MED ORDER — ZOLPIDEM TARTRATE ER 6.25 MG PO TBCR: 6.2500 mg | EXTENDED_RELEASE_TABLET | Freq: Every evening | ORAL | 3 refills | Status: AC | PRN

## 2024-10-08 NOTE — Telephone Encounter (Signed)
 Patient came in person to ask for a refill on Zolpidem  tart ER 6.25 mg tab. She was trying to get it refill before the weather gets bad. Please advise.

## 2024-10-08 NOTE — Telephone Encounter (Signed)
 Thank you. I will let her know.

## 2024-10-08 NOTE — Progress Notes (Signed)
 Will discuss results with patient next visit on 10/22/24.

## 2024-10-08 NOTE — Telephone Encounter (Signed)
 I sent refill order to her pharmacy.

## 2024-10-14 ENCOUNTER — Ambulatory Visit: Payer: Self-pay

## 2024-10-22 ENCOUNTER — Ambulatory Visit: Payer: Self-pay

## 2024-11-23 ENCOUNTER — Ambulatory Visit
